# Patient Record
Sex: Male | Born: 1985 | Race: Black or African American | Hispanic: No | Marital: Single | Smoking: Current every day smoker
Health system: Southern US, Community
[De-identification: ages and names within clinical notes are randomized; demographics above are authoritative.]

## PROBLEM LIST (undated history)

## (undated) DIAGNOSIS — J45909 Unspecified asthma, uncomplicated: Secondary | ICD-10-CM

---
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
## 2012-05-28 ENCOUNTER — Encounter (HOSPITAL_COMMUNITY): Payer: Self-pay | Admitting: *Deleted

## 2012-05-28 ENCOUNTER — Emergency Department (HOSPITAL_COMMUNITY)
Admission: EM | Admit: 2012-05-28 | Discharge: 2012-05-28 | Disposition: A | Discharge status: Home / Self Care | Payer: Self-pay | Attending: Emergency Medicine | Admitting: Emergency Medicine

## 2012-05-28 ENCOUNTER — Emergency Department (HOSPITAL_COMMUNITY): Payer: Self-pay

## 2012-05-28 DIAGNOSIS — K5900 Constipation, unspecified: Secondary | ICD-10-CM | POA: Insufficient documentation

## 2012-05-28 DIAGNOSIS — R109 Unspecified abdominal pain: Secondary | ICD-10-CM | POA: Insufficient documentation

## 2012-05-28 MED ORDER — POLYETHYLENE GLYCOL 3350 17 GM/SCOOP PO POWD: 17.0000 g | Freq: Every day | ORAL | Status: AC

## 2012-05-28 MED ORDER — MAGNESIUM CITRATE PO SOLN
1.0000 | Freq: Once | ORAL | Status: AC
  Administered 2012-05-28: 1 via ORAL
  Filled 2012-05-28: qty 296

## 2012-05-28 NOTE — ED Notes (Signed)
Pt sts he has not been able to have bowel movement since Friday. Normally has 2-3 bowel movements per day. Has not had appetite since Friday, currently nauseated, no emesis.

## 2012-05-28 NOTE — Discharge Instructions (Signed)
You were seen and evaluated today for your complaints of constipation. At this time your x-rays do not show any emergent causes of your symptoms. Please use the medication you were prescribed to help with your constipation. Please use this on the first day by taking a large scoops and 24 ounce glass of water or beverage to help produce a bowel movement. Then continue to use 1-2 scoops daily as needed for soft regular bowel movements. Please followup with a primary care provider for continued evaluation and treatment.   Constipation in Adults Constipation is having fewer than 2 bowel movements per week. Usually, the stools are hard. As we grow older, constipation is more common. If you try to fix constipation with laxatives, the problem may get worse. This is because laxatives taken over a long period of time make the colon muscles weaker. A low-fiber diet, not taking in enough fluids, and taking some medicines may make these problems worse. MEDICATIONS THAT MAY CAUSE CONSTIPATION  Water pills (diuretics).   Calcium channel blockers (used to control blood pressure and for the heart).   Certain pain medicines (narcotics).   Anticholinergics.   Anti-inflammatory agents.   Antacids that contain aluminum.  DISEASES THAT CONTRIBUTE TO CONSTIPATION  Diabetes.   Parkinson's disease.   Dementia.   Stroke.   Depression.   Illnesses that cause problems with salt and water metabolism.  HOME CARE INSTRUCTIONS   Constipation is usually best cared for without medicines. Increasing dietary fiber and eating more fruits and vegetables is the best way to manage constipation.   Slowly increase fiber intake to 25 to 38 grams per day. Whole grains, fruits, vegetables, and legumes are good sources of fiber. A dietitian can further help you incorporate high-fiber foods into your diet.   Drink enough water and fluids to keep your urine clear or pale yellow.   A fiber supplement may be added to your  diet if you cannot get enough fiber from foods.   Increasing your activities also helps improve regularity.   Suppositories, as suggested by your caregiver, will also help. If you are using antacids, such as aluminum or calcium containing products, it will be helpful to switch to products containing magnesium if your caregiver says it is okay.   If you have been given a liquid injection (enema) today, this is only a temporary measure. It should not be relied on for treatment of longstanding (chronic) constipation.   Stronger measures, such as magnesium sulfate, should be avoided if possible. This may cause uncontrollable diarrhea. Using magnesium sulfate may not allow you time to make it to the bathroom.  SEEK IMMEDIATE MEDICAL CARE IF:   There is bright red blood in the stool.   The constipation stays for more than 4 days.   There is belly (abdominal) or rectal pain.   You do not seem to be getting better.   You have any questions or concerns.  MAKE SURE YOU:   Understand these instructions.   Will watch your condition.   Will get help right away if you are not doing well or get worse.  Document Released: 09/02/2004 Document Revised: 11/24/2011 Document Reviewed: 11/08/2011 [facility] Patient Information 2012 [place], [place].

## 2012-05-28 NOTE — ED Notes (Signed)
Pt c/o unable to have bowel movement since Friday (4 days); took laxatives and suppository yesterday; c/o abd pain; no results after meds

## 2012-05-28 NOTE — ED Provider Notes (Signed)
History     CSN: [id number]  Arrival date & time 05/28/12  8295   First MD Initiated Contact with Patient 05/28/12 2114      Chief Complaint  Patient presents with  . Abdominal Pain   HPI  History provided by the patient. Patient is a 26 year old male with no significant past medical history who presents with complaints of constipation and diffuse abdominal pains. Patient reports last normal bowel movement was Friday afternoon, 3 days ago. Patient reports having regular bowel movements daily but is not had any additional bowel movements. He had increasing crampy pains and abdomen. Pain is diffuse. Pain is waxing and waning and described as severe. Patient did have one episode of vomiting related to severe pains. Patient has tried using laxatives and a suppository yesterday evening without any improvements. Patient denies any other aggravating or alleviating factors. Symptoms have also been associated with decreased appetite. Symptoms are not associated with any fever, chills, sweats. he has no history of abdominal surgery.    History reviewed. No pertinent past medical history.  History reviewed. No pertinent past surgical history.  History reviewed. No pertinent family history.  History  Substance Use Topics  . Smoking status: Never Smoker   . Smokeless tobacco: Not on file  . Alcohol Use:       Review of Systems  Constitutional: Negative for fever and chills.  Respiratory: Negative for cough.   Gastrointestinal: Positive for abdominal pain and constipation. Negative for nausea, vomiting, diarrhea, blood in stool and rectal pain.  Genitourinary: Negative for dysuria, frequency, hematuria and flank pain.    Allergies  Review of patient's allergies indicates no known allergies.  Home Medications  No current outpatient prescriptions on file.  BP 120/78  Pulse 90  Temp(Src) 99 F (37.2 C) (Oral)  Resp 20  SpO2 100%  Physical Exam  Nursing note and vitals  reviewed. Constitutional: He is oriented to person, place, and time. He appears well-developed and well-nourished. No distress.  HENT:  Head: Normocephalic and atraumatic.  Cardiovascular: Normal rate and regular rhythm.   Pulmonary/Chest: Effort normal and breath sounds normal. No respiratory distress. He has no wheezes. He has no rales.  Abdominal: Soft. He exhibits no distension. There is tenderness. There is no rebound and no guarding.       Diffuse tenderness. No significant distention. Decreased bowel sounds.  Genitourinary: Rectum normal.       No fecal impaction  Neurological: He is alert and oriented to person, place, and time.  Skin: Skin is warm.  Psychiatric: He has a normal mood and affect. His behavior is normal.    ED Course  Procedures  Dg Abd Acute W/chest  05/28/2012  *RADIOLOGY REPORT*  Clinical Data: Abdominal pain  ACUTE ABDOMEN SERIES (ABDOMEN 2 VIEW & CHEST 1 VIEW)  Comparison: None.  Findings: Cardiomediastinal silhouette is within normal limits. The lungs are clear. No pleural effusion.  No pneumothorax.  No acute osseous abnormality.  No free air beneath the diaphragms.  Normal bowel gas pattern.  7 mm radiopacity projects over the right mid kidney region.  This could represent a calculus or bowel content.  No air fluid levels.  Osseous structures are intact.  IMPRESSION: Normal bowel gas pattern.  No acute cardiopulmonary process.  7 mm possible right renal calculus or bowel content.  Original Report Authenticated By: Harrel Lemon, M.D.     1. Constipation       MDM  Patient seen and evaluated. Patient no acute  distress.        Angus Seller, [place] 05/29/12 1824

## 2012-05-28 NOTE — ED Notes (Signed)
Patient given discharge instructions, information, prescriptions, and diet order. Patient states that they adequately understand discharge information given and to return to ED if symptoms return or worsen.    Patient given information about Magnesium Citrate. Patient sts that he understands instructions.

## 2012-06-01 NOTE — ED Provider Notes (Signed)
Medical screening examination/treatment/procedure(s) were performed by non-physician practitioner and as supervising physician I was immediately available for consultation/collaboration.  Raeford Razor, MD 06/01/12 [phone]

## 2013-06-25 IMAGING — CR DG ABDOMEN ACUTE W/ 1V CHEST
4 series · 4 of 4 positions shown · non-contrast
Comparison: None.

CLINICAL DATA: Abdominal pain

ACUTE ABDOMEN SERIES (ABDOMEN 2 VIEW & CHEST 1 VIEW)

[w chest pa]
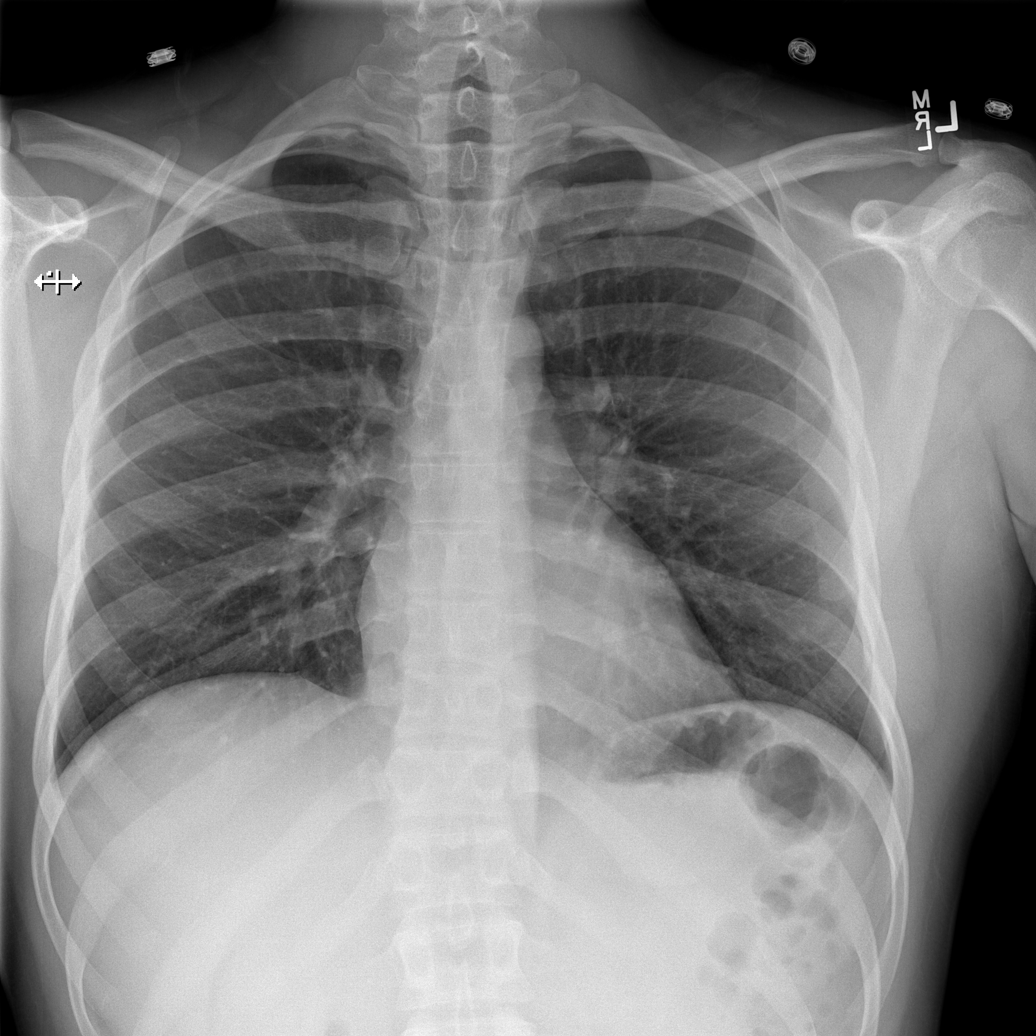

[w abdomen upright]
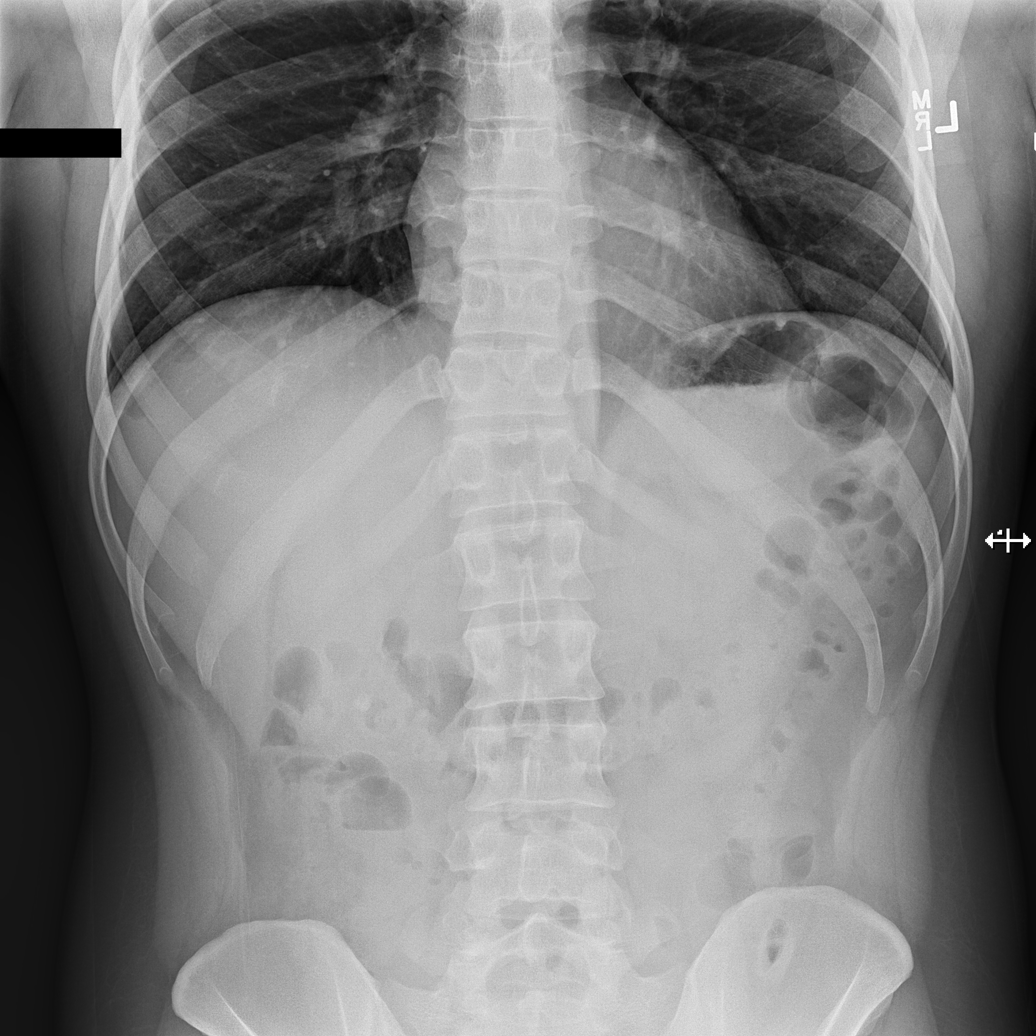

[t abdomen supine (1 of 2)]
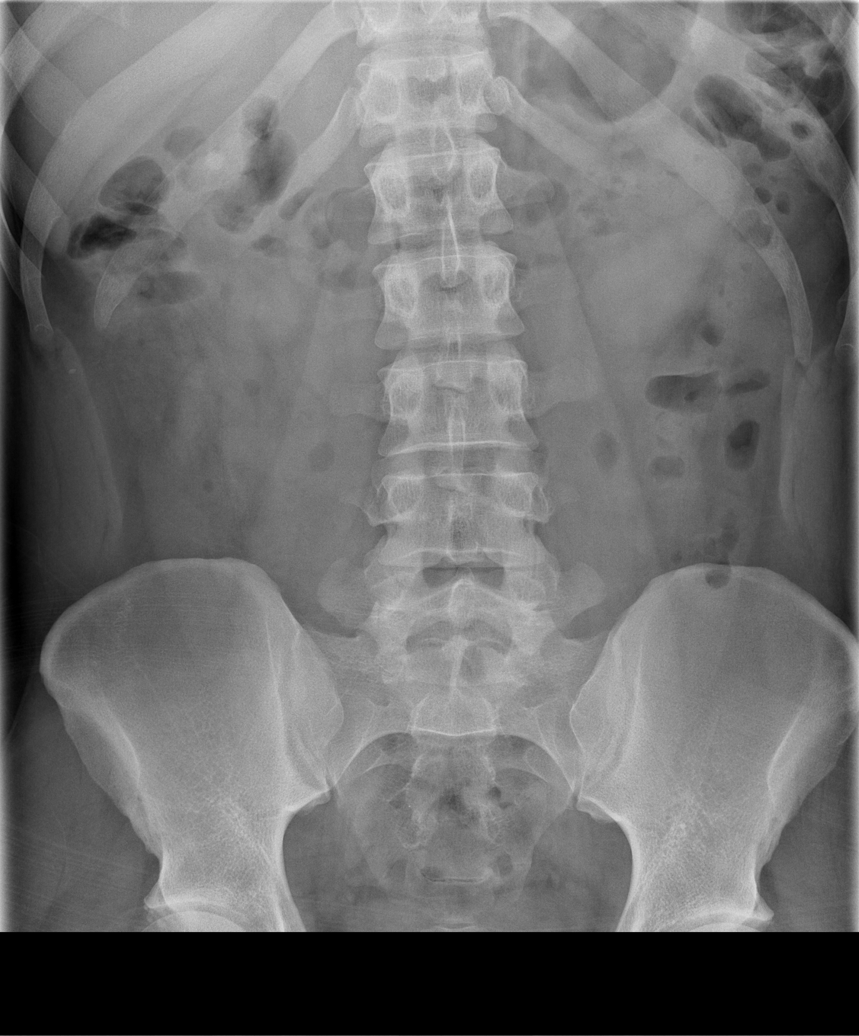

[t abdomen supine (2 of 2)]
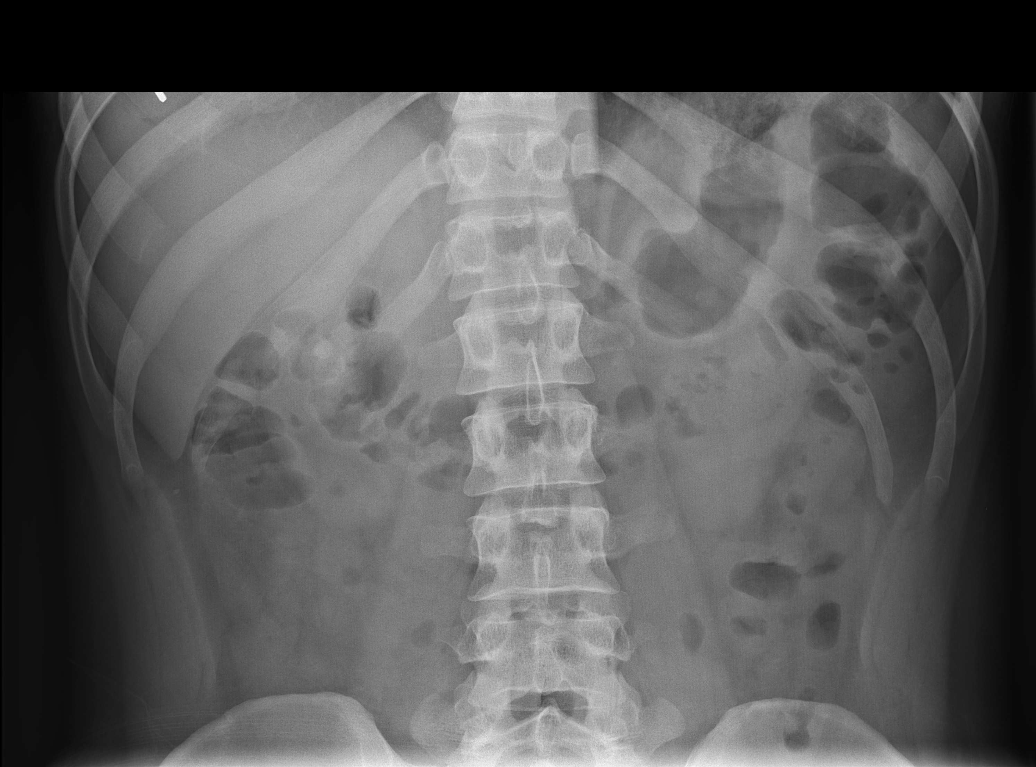

[4 of 4 positions shown; findings below may reference images not displayed]

FINDINGS: Cardiomediastinal silhouette is within normal limits. The
lungs are clear. No pleural effusion.  No pneumothorax.  No acute
osseous abnormality.  No free air beneath the diaphragms.

Normal bowel gas pattern.  7 mm radiopacity projects over the right
mid kidney region.  This could represent a calculus or bowel
content.  No air fluid levels.  Osseous structures are intact.
IMPRESSION: Normal bowel gas pattern.  No acute cardiopulmonary process.

7 mm possible right renal calculus or bowel content.

## 2014-06-17 ENCOUNTER — Encounter (HOSPITAL_COMMUNITY): Payer: Self-pay | Admitting: Emergency Medicine

## 2014-06-17 ENCOUNTER — Emergency Department (HOSPITAL_COMMUNITY)
Admission: EM | Admit: 2014-06-17 | Discharge: 2014-06-17 | Disposition: A | Discharge status: Home / Self Care | Payer: Self-pay | Attending: Emergency Medicine | Admitting: Emergency Medicine

## 2014-06-17 DIAGNOSIS — L03019 Cellulitis of unspecified finger: Secondary | ICD-10-CM | POA: Insufficient documentation

## 2014-06-17 DIAGNOSIS — J45909 Unspecified asthma, uncomplicated: Secondary | ICD-10-CM | POA: Insufficient documentation

## 2014-06-17 DIAGNOSIS — L03012 Cellulitis of left finger: Secondary | ICD-10-CM

## 2014-06-17 HISTORY — DX: Unspecified asthma, uncomplicated: J45.909

## 2014-06-17 NOTE — ED Notes (Signed)
Patient here with complaint of left 5th finger swelling. States that it started about a week ago. Initially was just swelling, today finger began to show puss filled pocket at cuticle. Patient presents because he is more concerned about it since the discoloration started.

## 2014-06-17 NOTE — ED Provider Notes (Signed)
CSN: [id number]     Arrival date & time 06/17/14  1924 History  This chart was scribed for Fernando EmeryNicole Pisciotta, PA-C working with Rolland PorterMark James, MD by Evon Slackerrance Branch, ED Scribe. This patient was seen in room TR05C/TR05C and the patient's care was started at 7:59 PM.      Chief Complaint  Patient presents with  . Abscess   Patient is a 28 y.o. male presenting with abscess. The history is provided by the patient. No language interpreter was used.  Abscess Associated symptoms: no fever    HPI Comments: Fernando Hansen is a 28 y.o. male who presents to the Emergency Department complaining of left finger abscess on the 5th digit onset 4 days prior. He states that his finger nail started turning green today and was concerned. He states that he frequently bites his nails. He denies fever or chills. Patient is left-hand dominant  Past Medical History  Diagnosis Date  . Asthma     Childhood   History reviewed. No pertinent past surgical history. History reviewed. No pertinent family history. History  Substance Use Topics  . Smoking status: Never Smoker   . Smokeless tobacco: Not on file  . Alcohol Use: No    Review of Systems  Constitutional: Negative for fever and chills.   A complete 10 system review of systems was obtained and all systems are negative except as noted in the HPI and PMH.   Allergies  Review of patient's allergies indicates no known allergies.  Home Medications   Prior to Admission medications   Medication Sig Start Date End Date Taking? Authorizing Alany Borman  ibuprofen (ADVIL,MOTRIN) 200 MG tablet Take 200 mg by mouth every 6 (six) hours as needed (pain).   Yes Historical Othniel Maret, MD   Triage Vitals: BP 122/76  Pulse 84  Temp(Src) 98.5 F (36.9 C) (Oral)  Resp 20  Ht 6' (1.829 m)  Wt 185 lb (83.915 kg)  BMI 25.08 kg/m2  SpO2 97%  Physical Exam  Nursing note and vitals reviewed. Constitutional: He is oriented to person, place, and time. He appears  well-developed and well-nourished. No distress.  HENT:  Head: Normocephalic and atraumatic.  Eyes: Conjunctivae and EOM are normal.  Neck: Neck supple. No tracheal deviation present.  Cardiovascular: Normal rate.   Pulmonary/Chest: Effort normal. No respiratory distress.  Musculoskeletal: Normal range of motion.  Neurological: He is alert and oriented to person, place, and time.  Skin: Skin is warm and dry.  Patient with fluctuant paronychia to left fifth digit lateral nail folds. No felon.   Psychiatric: He has a normal mood and affect. His behavior is normal.    ED Course  Procedures (including critical care time)  INCISION AND DRAINAGE Performed by: Fernando EmeryPISCIOTTA, Fernando Consent: Verbal consent obtained. Risks and benefits: risks, benefits and alternatives were discussed Type: abscess  Body area: Left fifth digit  Anesthesia: local infiltration  Incision was made with a scalpel.  Local anesthetic: None  Complexity: complex Blunt dissection to break up loculations  Drainage: purulent  Drainage amount: 1 mL   Packing material: None   Patient tolerance: Patient tolerated the procedure well with no immediate complications.    DIAGNOSTIC STUDIES: Oxygen Saturation is 97% on RA, norma by my interpretation.    COORDINATION OF CARE:    Labs Review Labs Reviewed - No data to display  Imaging Review No results found.   EKG Interpretation None      MDM   Final diagnoses:  Paronychia of fifth finger,  left    Filed Vitals:   06/17/14 1947 06/17/14 2030  BP: 122/76 113/71  Pulse: 84 81  Temp: 98.5 F (36.9 C)   TempSrc: Oral   Resp: 20 16  Height: 6' (1.829 m)   Weight: 185 lb (83.915 kg)   SpO2: 97% 100%    Medications - No data to display  Fernando HeadingMarkell R Kropf is a 28 y.o. male presenting with paronychia left fifth digit. Likely secondary to biting his nails. Patient is left-hand dominant. No surrounding cellulitis. Incision and drainage performed.  Wound was not packed.  Evaluation does not show pathology that would require ongoing emergent intervention or inpatient treatment. Pt is hemodynamically stable and mentating appropriately. Discussed findings and plan with patient/guardian, who agrees with care plan. All questions answered. Return precautions discussed and outpatient follow up given.      I personally performed the services described in this documentation, which was scribed in my presence. The recorded information has been reviewed and is accurate.      Fernando Emeryicole Pisciotta, PA-C 06/17/14 2119

## 2014-06-17 NOTE — Discharge Instructions (Signed)
Do not hesitate to return to the Emergency Department for any new, worsening or concerning symptoms.   If you see signs of worsining infection (warmth, redness, tenderness, pus, sharp increase in pain, fever, red streaking) immediately return to the emergency department.   If you do not have a primary care doctor you can establish one at the   [facility]: [address] [place] [id number] [phone]  After you establish care. Let them know you were seen in the emergency room. They must obtain records for further management.     Paronychia Paronychia is an inflammatory reaction involving the folds of the skin surrounding the fingernail. This is commonly caused by an infection in the skin around a nail. The most common cause of paronychia is frequent wetting of the hands (as seen with bartenders, food servers, nurses or others who wet their hands). This makes the skin around the fingernail susceptible to infection by bacteria (germs) or fungus. Other predisposing factors are:  Aggressive manicuring.  Nail biting.  Thumb sucking. The most common cause is a staphylococcal (a type of germ) infection, or a fungal (Candida) infection. When caused by a germ, it usually comes on suddenly with redness, swelling, pus and is often painful. It may get under the nail and form an abscess (collection of pus), or form an abscess around the nail. If the nail itself is infected with a fungus, the treatment is usually prolonged and may require oral medicine for up to one year. Your caregiver will determine the length of time treatment is required. The paronychia caused by bacteria (germs) may largely be avoided by not pulling on hangnails or picking at cuticles. When the infection occurs at the tips of the finger it is called felon. When the cause of paronychia is from the herpes simplex virus (HSV) it is called herpetic whitlow. TREATMENT  When an abscess is present treatment is often  incision and drainage. This means that the abscess must be cut open so the pus can get out. When this is done, the following home care instructions should be followed. HOME CARE INSTRUCTIONS   It is important to keep the affected fingers very dry. Rubber or plastic gloves over cotton gloves should be used whenever the hand must be placed in water.  Keep wound clean, dry and dressed as suggested by your caregiver between warm soaks or warm compresses.  Soak in warm water for fifteen to twenty minutes three to four times per day for bacterial infections. Fungal infections are very difficult to treat, so often require treatment for long periods of time.  For bacterial (germ) infections take antibiotics (medicine which kill germs) as directed and finish the prescription, even if the problem appears to be solved before the medicine is gone.  Only take over-the-counter or prescription medicines for pain, discomfort, or fever as directed by your caregiver. SEEK IMMEDIATE MEDICAL CARE IF:  You have redness, swelling, or increasing pain in the wound.  You notice pus coming from the wound.  You have a fever.  You notice a bad smell coming from the wound or dressing. Document Released: 05/31/2001 Document Revised: 02/27/2012 Document Reviewed: 01/30/2009 [facility] Patient Information 2015 [place], [place]. This information is not intended to replace advice given to you by your health care provider. Make sure you discuss any questions you have with your health care provider.

## 2014-06-24 NOTE — ED Provider Notes (Signed)
Medical screening examination/treatment/procedure(s) were performed by non-physician practitioner and as supervising physician I was immediately available for consultation/collaboration.   EKG Interpretation None        Mark James, MD 06/24/14 0315 

## 2017-07-29 ENCOUNTER — Encounter (HOSPITAL_COMMUNITY): Payer: Self-pay | Admitting: Emergency Medicine

## 2017-07-29 ENCOUNTER — Emergency Department (HOSPITAL_COMMUNITY)
Admission: EM | Admit: 2017-07-29 | Discharge: 2017-07-29 | Disposition: A | Discharge status: Home / Self Care | Payer: Self-pay | Attending: Emergency Medicine | Admitting: Emergency Medicine

## 2017-07-29 DIAGNOSIS — Y990 Civilian activity done for income or pay: Secondary | ICD-10-CM | POA: Insufficient documentation

## 2017-07-29 DIAGNOSIS — Y9289 Other specified places as the place of occurrence of the external cause: Secondary | ICD-10-CM | POA: Insufficient documentation

## 2017-07-29 DIAGNOSIS — S61216A Laceration without foreign body of right little finger without damage to nail, initial encounter: Secondary | ICD-10-CM | POA: Insufficient documentation

## 2017-07-29 DIAGNOSIS — Z23 Encounter for immunization: Secondary | ICD-10-CM | POA: Insufficient documentation

## 2017-07-29 DIAGNOSIS — W268XXA Contact with other sharp object(s), not elsewhere classified, initial encounter: Secondary | ICD-10-CM | POA: Insufficient documentation

## 2017-07-29 DIAGNOSIS — Y9389 Activity, other specified: Secondary | ICD-10-CM | POA: Insufficient documentation

## 2017-07-29 DIAGNOSIS — J45909 Unspecified asthma, uncomplicated: Secondary | ICD-10-CM | POA: Insufficient documentation

## 2017-07-29 MED ORDER — TETANUS-DIPHTH-ACELL PERTUSSIS 5-2.5-18.5 LF-MCG/0.5 IM SUSP
0.5000 mL | Freq: Once | INTRAMUSCULAR | Status: AC
  Administered 2017-07-29: 0.5 mL via INTRAMUSCULAR
  Filled 2017-07-29: qty 0.5

## 2017-07-29 MED ORDER — LIDOCAINE HCL (PF) 1 % IJ SOLN
5.0000 mL | Freq: Once | INTRAMUSCULAR | Status: AC
  Administered 2017-07-29: 5 mL
  Filled 2017-07-29: qty 5

## 2017-07-29 MED ORDER — LIDOCAINE-EPINEPHRINE-TETRACAINE (LET) SOLUTION
3.0000 mL | Freq: Once | NASAL | Status: AC
  Administered 2017-07-29: 3 mL via TOPICAL
  Filled 2017-07-29: qty 3

## 2017-07-29 NOTE — Discharge Instructions (Signed)
Please read and follow all provided instructions.  Your diagnoses today include:  1. Laceration of right little finger without foreign body without damage to nail, initial encounter     Tests performed today include: X-ray of the affected area that did not show any foreign bodies or broken bones Vital signs. See below for your results today.   Medications prescribed:   Take any prescribed medications only as directed.   Home care instructions:  Follow any educational materials and wound care instructions contained in this packet.   You may shower and wash the area with soap and water, just be sure to pat the area dry and not rub over the stitches. Do no put your stiches underwater (in a bath, pool, or lake). Getting stiches wet can slow down healing and increase your chances of getting an infection. You may apply Bacitracin or Neosporin twice a day for 7 days, and keep the ara clean with  bandage or gauze. Do not apply alcohol or hydrogen peroxide. Cover the area if it draining or weeping.   Follow-up instructions: Suture Removal: Return to the Emergency Department or see your primary care care doctor in 8-10 days for a recheck of your wound and removal of your sutures or staples.    Return instructions:  Return to the Emergency Department if you have: Fever Worsening pain Worsening swelling of the wound Pus draining from the wound Redness of the skin that moves away from the wound, especially if it streaks away from the affected area  Any other emergent concerns  Your vital signs today were: BP (!) 156/95 (BP Location: Left Arm)    Pulse 78    Temp 98.2 F (36.8 C) (Oral)    Resp 16    Ht 5\' 11"  (1.803 m)    Wt 81.6 kg (180 lb)    SpO2 100%    BMI 25.10 kg/m  If your blood pressure (BP) was elevated above 135/85 this visit, please have this repeated by your doctor within one month. --------------

## 2017-07-29 NOTE — ED Triage Notes (Signed)
Pt reports cutting R pinky on grill shield at work. Bleeding is constant, pressure dressing applied in triage. Approx 2-3cm lac noted to R pinky.

## 2017-07-29 NOTE — ED Notes (Signed)
PA at bedside to repair laceration to left pinky finger.

## 2017-07-29 NOTE — ED Provider Notes (Signed)
[facility]-EMERGENCY DEPT Provider Note   CSN: [id number] Arrival date & time: 07/29/17  0139     History   Chief Complaint Chief Complaint  Patient presents with  . Laceration    HPI Fernando Hansen is a 31 y.o. male.  HPI  31 y.o. male with a hx of Asthma, presents to the Emergency Department today due to cutting right little finger on grill shield at work. Noted bleeding and came to ED as he could not stop. ROM intact. Pain 1/10. Throbbing. Does not take blood thinners. No numbness/tingling. No other symptoms noted.   Past Medical History:  Diagnosis Date  . Asthma    Childhood    There are no active problems to display for this patient.   History reviewed. No pertinent surgical history.     Home Medications    Prior to Admission medications   Medication Sig Start Date End Date Taking? Authorizing Provider  ibuprofen (ADVIL,MOTRIN) 200 MG tablet Take 200 mg by mouth every 6 (six) hours as needed (pain).    [provider]    Family History No family history on file.  Social History Social History  Substance Use Topics  . Smoking status: Never Smoker  . Smokeless tobacco: Not on file  . Alcohol use No     Allergies   Patient has no known allergies.   Review of Systems Review of Systems  Constitutional: Negative for fever.  Gastrointestinal: Negative for nausea.  Musculoskeletal: Positive for myalgias.  Skin: Positive for wound.  Allergic/Immunologic: Negative for immunocompromised state.     Physical Exam Updated Vital Signs BP (!) 156/95 (BP Location: Left Arm)   Pulse 78   Temp 98.2 F (36.8 C) (Oral)   Resp 16   Ht 5\' 11"  (1.803 m)   Wt 81.6 kg (180 lb)   SpO2 100%   BMI 25.10 kg/m   Physical Exam  Constitutional: He is oriented to person, place, and time. Vital signs are normal. He appears well-developed and well-nourished.  HENT:  Head: Normocephalic and atraumatic.  Right Ear: Hearing normal.  Left Ear: Hearing  normal.  Eyes: Pupils are equal, round, and reactive to light. Conjunctivae and EOM are normal.  Neck: Normal range of motion. Neck supple.  Cardiovascular: Normal rate and regular rhythm.   Pulmonary/Chest: Effort normal.  Musculoskeletal: Normal range of motion.  Neurological: He is alert and oriented to person, place, and time.  Skin: Skin is warm and dry.  Right 5th digit with 2 cm laceration on distal phalanx. Bottom of wound visualized. Bleeding noted. ROM intact. Cap refill <2sec.  Psychiatric: He has a normal mood and affect. His speech is normal and behavior is normal. Thought content normal.  Nursing note and vitals reviewed.    ED Treatments / Results  Labs (all labs ordered are listed, but only abnormal results are displayed) Labs Reviewed - No data to display  EKG  EKG Interpretation None       Radiology No results found.  Procedures .Marland Kitchen.Laceration Repair Date/Time: 07/29/2017 4:03 AM Performed by: Audry PiliMOHR, Klyn Kroening Authorized by: Audry PiliMOHR, Thijs Brunton   Consent:    Consent obtained:  Verbal   Consent given by:  Patient   Risks discussed:  Infection, pain, poor cosmetic result and poor wound healing Anesthesia (see MAR for exact dosages):    Anesthesia method:  Nerve block   Block location:  Digital   Block needle gauge:  25 G   Block anesthetic:  Lidocaine 1% w/o epi  Block injection procedure:  Anatomic landmarks identified, anatomic landmarks palpated, negative aspiration for blood, introduced needle and incremental injection   Block outcome:  Anesthesia achieved Laceration details:    Location:  Finger   Finger location:  R small finger   Length (cm):  2 Repair type:    Repair type:  Simple Pre-procedure details:    Preparation:  Patient was prepped and draped in usual sterile fashion Exploration:    Hemostasis achieved with:  LET and tourniquet   Wound exploration: entire depth of wound probed and visualized   Treatment:    Area cleansed with:  Hibiclens,  Betadine and saline   Amount of cleaning:  Extensive   Irrigation method:  Pressure wash Skin repair:    Repair method:  Sutures   Suture size:  4-0   Suture material:  Prolene   Suture technique:  Simple interrupted   Number of sutures:  4 Approximation:    Approximation:  Close   Vermilion border: well-aligned   Post-procedure details:    Dressing:  Antibiotic ointment   Patient tolerance of procedure:  Tolerated well, no immediate complications   (including critical care time)  Medications Ordered in ED Medications - No data to display   Initial Impression / Assessment and Plan / ED Course  I have reviewed the triage vital signs and the nursing notes.  Pertinent labs & imaging results that were available during my care of the patient were reviewed by me and considered in my medical decision making (see chart for details).  Final Clinical Impressions(s) / ED Diagnoses     {I have reviewed the relevant previous healthcare records.  {I obtained HPI from historian.   ED Course:  Assessment: Patient is a 31 y.o. male that presents with laceration to right 5th digit. Tdap booster given. Pressure irrigation performed. Bottom of the wound visualized with bleeding controled. Laceration occurred < 8 hours prior to repair which was well tolerated. Pt has no co morbidities to effect normal wound healing.   Discussed suture home care w pt and answered questions. Pt to f-u for wound check and suture removal in 8-10 days. Pt is hemodynamically stable w no complaints prior to dc.    Disposition/Plan:  DC Home Additional Verbal discharge instructions given and discussed with patient.  Pt Instructed to f/u with PCP in the next week for evaluation and treatment of symptoms. Return precautions given Pt acknowledges and agrees with plan  Supervising Physician Devoria Albe, MD  Final diagnoses:  Laceration of right little finger without foreign body without damage to nail, initial encounter      New Prescriptions New Prescriptions   No medications on file     Audry Pili, Cordelia Poche 07/29/17 0405    Devoria Albe, MD 07/29/17 [phone]

## 2017-08-07 ENCOUNTER — Emergency Department (HOSPITAL_COMMUNITY)
Admission: EM | Admit: 2017-08-07 | Discharge: 2017-08-07 | Disposition: A | Discharge status: Home / Self Care | Payer: Self-pay | Attending: Emergency Medicine | Admitting: Emergency Medicine

## 2017-08-07 ENCOUNTER — Encounter (HOSPITAL_COMMUNITY): Payer: Self-pay | Admitting: *Deleted

## 2017-08-07 DIAGNOSIS — J45909 Unspecified asthma, uncomplicated: Secondary | ICD-10-CM | POA: Insufficient documentation

## 2017-08-07 DIAGNOSIS — Z4802 Encounter for removal of sutures: Secondary | ICD-10-CM | POA: Insufficient documentation

## 2017-08-07 DIAGNOSIS — F17200 Nicotine dependence, unspecified, uncomplicated: Secondary | ICD-10-CM | POA: Insufficient documentation

## 2017-08-07 NOTE — ED Triage Notes (Signed)
Pt In requesting sutures removed from R pinky finger after cutting it at work last wk, pt rcvd sutures here, A&O x4, denies fever, chills, and redness

## 2017-08-07 NOTE — ED Provider Notes (Signed)
[facility]PT Provider Note   CSN: [id number] Arrival date & time: 08/07/17  1532     History   Chief Complaint Chief Complaint  Patient presents with  . Suture / Staple Removal    HPI Fernando Hansen is a 31 y.o. male.  Patient received sutured wound closure to right pinky finger on 07/29/17. Has been performing wound care daily. Denies pain, purulent drainage.   The history is provided by the patient and medical records. No language interpreter was used.  Suture / Staple Removal  The current episode started more than 1 week ago.    Past Medical History:  Diagnosis Date  . Asthma    Childhood    There are no active problems to display for this patient.   History reviewed. No pertinent surgical history.     Home Medications    Prior to Admission medications   Medication Sig Start Date End Date Taking? Authorizing Provider  ibuprofen (ADVIL,MOTRIN) 200 MG tablet Take 200 mg by mouth every 6 (six) hours as needed (pain).    [provider]    Family History No family history on file.  Social History Social History  Substance Use Topics  . Smoking status: Current Every Day Smoker    Packs/day: 0.25  . Smokeless tobacco: Never Used  . Alcohol use No     Allergies   Patient has no known allergies.   Review of Systems Review of Systems  Skin: Positive for wound.  All other systems reviewed and are negative.    Physical Exam Updated Vital Signs BP 120/76 (BP Location: Left Arm)   Pulse 64   Temp 98.3 F (36.8 C) (Oral)   Resp 16   Ht 6' (1.829 m)   Wt 84.8 kg (187 lb)   SpO2 100%   BMI 25.36 kg/m   Physical Exam  Constitutional: He is oriented to person, place, and time. He appears well-developed and well-nourished. No distress.  HENT:  Head: Normocephalic.  Eyes: Conjunctivae are normal.  Neck: Neck supple.  Cardiovascular: Normal rate and regular rhythm.   Pulmonary/Chest: Effort normal and breath sounds normal.    Abdominal: Soft. Bowel sounds are normal.  Musculoskeletal: Normal range of motion.  Well healing wound to right pinky finger without erythema or drainage.  Neurological: He is alert and oriented to person, place, and time.  Skin: Skin is warm and dry.  Psychiatric: He has a normal mood and affect.  Nursing note and vitals reviewed.    ED Treatments / Results  Labs (all labs ordered are listed, but only abnormal results are displayed) Labs Reviewed - No data to display  EKG  EKG Interpretation None       Radiology No results found.  Procedures .Suture Removal Date/Time: 08/07/2017 7:48 PM Performed by: Katrinka Blazing, Arianny Pun Authorized by: Katrinka Blazing, Nikko Quast   Consent:    Consent obtained:  Verbal   Consent given by:  Patient   Risks discussed:  Pain, wound separation and bleeding Location:    Location:  Upper extremity   Upper extremity location:  Hand   Hand location:  R small finger Procedure details:    Wound appearance:  No signs of infection, good wound healing, pink and clean   Number of sutures removed:  4 Post-procedure details:    Post-removal:  Antibiotic ointment applied and Band-Aid applied   Patient tolerance of procedure:  Tolerated well, no immediate complications   (including critical care time)  Medications Ordered in ED Medications -  No data to display   Initial Impression / Assessment and Plan / ED Course  I have reviewed the triage vital signs and the nursing notes.  Pertinent labs & imaging results that were available during my care of the patient were reviewed by me and considered in my medical decision making (see chart for details).     Well healing wound of right little finger.  Sutures removed.  Final Clinical Impressions(s) / ED Diagnoses   Final diagnoses:  Encounter for removal of sutures    New Prescriptions New Prescriptions   No medications on file     Felicie Morn, NP 08/08/17 [id number]    Raeford Razor, MD 08/15/17 1630

## 2017-08-07 NOTE — ED Notes (Signed)
Patient Alert and oriented X4. Stable and ambulatory. Patient verbalized understanding of the discharge instructions.  Patient belongings were taken by the patient.
